# Patient Record
Sex: Female | Born: 2005 | Race: White | Hispanic: Yes | Marital: Single | Smoking: Never smoker
Health system: Southern US, Community
[De-identification: ages and names within clinical notes are randomized; demographics above are authoritative.]

## PROBLEM LIST (undated history)

## (undated) DIAGNOSIS — L309 Dermatitis, unspecified: Secondary | ICD-10-CM

## (undated) HISTORY — DX: Dermatitis, unspecified: L30.9

## (undated) HISTORY — PX: TONSILLECTOMY: SUR1361

---
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
## 2023-03-12 ENCOUNTER — Ambulatory Visit (HOSPITAL_COMMUNITY)
Admission: EM | Admit: 2023-03-12 | Discharge: 2023-03-12 | Disposition: A | Discharge status: Home / Self Care | Payer: Medicaid Other | Attending: Emergency Medicine | Admitting: Emergency Medicine

## 2023-03-12 ENCOUNTER — Encounter (HOSPITAL_COMMUNITY): Payer: Self-pay | Admitting: Emergency Medicine

## 2023-03-12 ENCOUNTER — Other Ambulatory Visit: Payer: Self-pay

## 2023-03-12 DIAGNOSIS — R0981 Nasal congestion: Secondary | ICD-10-CM

## 2023-03-12 DIAGNOSIS — H65192 Other acute nonsuppurative otitis media, left ear: Secondary | ICD-10-CM | POA: Diagnosis not present

## 2023-03-12 MED ORDER — GUAIFENESIN ER 600 MG PO TB12: 600.0000 mg | ORAL_TABLET | Freq: Two times a day (BID) | ORAL | 0 refills | Status: AC

## 2023-03-12 MED ORDER — AMOXICILLIN-POT CLAVULANATE 875-125 MG PO TABS: 1.0000 | ORAL_TABLET | Freq: Two times a day (BID) | ORAL | 0 refills | Status: AC

## 2023-03-12 NOTE — ED Provider Notes (Signed)
[facility]    CSN: [id number] Arrival date & time: 03/12/23  1904     History   Chief Complaint Chief Complaint  Patient presents with   Otalgia    HPI Nancy Shannon is a 17 y.o. female.  Left ear pain for almost 2 weeks Pressure and fullness Worse when she blows her nose Also 1 week of nasal congestion. Reports yellow phlegm. Slight cough. 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   Past Surgical History:  Procedure Laterality Date   TONSILLECTOMY      OB History   No obstetric history on file.      Home Medications    Prior to Admission medications   Medication Sig Start Date End Date Taking? Authorizing Provider  amoxicillin-clavulanate (AUGMENTIN) 875-125 MG tablet Take 1 tablet by mouth every 12 (twelve) hours for 5 days. 03/12/23 03/17/23 Yes Breea Loncar, Lurena Joiner, PA-C  guaiFENesin (MUCINEX) 600 MG 12 hr tablet Take 1 tablet (600 mg total) by mouth 2 (two) times daily for 5 days. 03/12/23 03/17/23 Yes Leander Tout, Lurena Joiner, PA-C  fluticasone [facility]) 50 MCG/ACT nasal spray  12/26/19   [provider]  ibuprofen (ADVIL) 200 MG tablet Take 200 mg by mouth 3 (three) times daily. As needed    [provider]    Family History Family History  Problem Relation Age of Onset   Migraines Mother    Sinusitis Father    Heart murmur Brother     Social History Social History   Tobacco Use   Smoking status: Never   Smokeless tobacco: Never  Vaping Use   Vaping status: Never Used  Substance Use Topics   Alcohol use: No   Drug use: No     Allergies   Patient has no known allergies.   Review of Systems Review of Systems Per HPI  Physical Exam Triage Vital Signs ED Triage Vitals  Encounter Vitals Group     BP      Systolic BP Percentile      Diastolic BP Percentile       Pulse      Resp      Temp      Temp src      SpO2      Weight      Height      Head Circumference      Peak Flow      Pain Score      Pain Loc      Pain Education      Exclude from Growth Chart    No data found.  Updated Vital Signs BP 132/77 (BP Location: Right Arm)   Pulse 70   Temp 98.1 F (36.7 C) (Oral)   Resp 18   LMP 03/07/2023 (Approximate)   SpO2 97%    Physical Exam Vitals and nursing note reviewed.  Constitutional:      General: She is not in acute distress.    Appearance: She is not ill-appearing.  HENT:     Right Ear: Tympanic membrane and ear canal normal.     Left Ear: Tympanic membrane is injected and erythematous.     Ears:     Comments: Cloudy fluid behind left TM    Nose: Congestion and rhinorrhea present.     Mouth/Throat:     Mouth:  Mucous membranes are moist.     Pharynx: Oropharynx is clear. No posterior oropharyngeal erythema.  Eyes:     Extraocular Movements: Extraocular movements intact.     Pupils: Pupils are equal, round, and reactive to light.  Cardiovascular:     Rate and Rhythm: Normal rate and regular rhythm.  Pulmonary:     Effort: Pulmonary effort is normal.  Musculoskeletal:     Cervical back: Normal range of motion.  Skin:    General: Skin is warm and dry.  Neurological:     Mental Status: She is alert and oriented to person, place, and time.     UC Treatments / Results  Labs (all labs ordered are listed, but only abnormal results are displayed) Labs Reviewed - No data to display  EKG  Radiology No results found.  Procedures Procedures   Medications Ordered in UC Medications - No data to display  Initial Impression / Assessment and Plan / UC Course  I have reviewed the triage vital signs and the nursing notes.  Pertinent labs & imaging results that were available during my care of the patient were reviewed by me and considered in my medical decision making (see chart for details).  Left otitis  media Augmentin BID x 5 Discussed this would also cover for any bacterial sinus etiology as well.  Recommend Mucinex twice a day with lots of fluids for further nasal congestion relief.  Return precautions.  Patient agreeable to plan  Final Clinical Impressions(s) / UC Diagnoses   Final diagnoses:  Other non-recurrent acute nonsuppurative otitis media of left ear  Nasal congestion     Discharge Instructions      Please take medication as prescribed. Augmentin twice daily for 5 days Take with food to avoid upset stomach.  The mucinex can be used twice a day to help with congestion Take with LOTS of fluids!!  Return if needed     ED Prescriptions     Medication Sig Dispense Auth. Provider   guaiFENesin (MUCINEX) 600 MG 12 hr tablet Take 1 tablet (600 mg total) by mouth 2 (two) times daily for 5 days. 10 tablet Emile Kyllo, PA-C   amoxicillin-clavulanate (AUGMENTIN) 875-125 MG tablet Take 1 tablet by mouth every 12 (twelve) hours for 5 days. 10 tablet Audi Wettstein, Lurena Joiner, PA-C      PDMP not reviewed this encounter.   Kathrine Haddock 03/12/23 2037

## 2023-03-12 NOTE — ED Triage Notes (Signed)
Left ear pain, feels like air or water in ear.  Pain in left ear when plowing nose.  Patient has stuffy nose and cough.  Illness started on Friday.  Currently continues to have yellow phlegm.  Ear pain started prior to recent illness  Patient has taken tylenol

## 2023-03-12 NOTE — Discharge Instructions (Addendum)
Please take medication as prescribed. Augmentin twice daily for 5 days Take with food to avoid upset stomach.  The mucinex can be used twice a day to help with congestion Take with LOTS of fluids!!  Return if needed

## 2023-05-11 ENCOUNTER — Encounter (HOSPITAL_COMMUNITY): Payer: Self-pay

## 2023-05-11 ENCOUNTER — Ambulatory Visit (HOSPITAL_COMMUNITY)
Admission: EM | Admit: 2023-05-11 | Discharge: 2023-05-11 | Disposition: A | Discharge status: Home / Self Care | Payer: Medicaid Other | Attending: Family Medicine | Admitting: Family Medicine

## 2023-05-11 DIAGNOSIS — M26623 Arthralgia of bilateral temporomandibular joint: Secondary | ICD-10-CM | POA: Diagnosis not present

## 2023-05-11 MED ORDER — IBUPROFEN 600 MG PO TABS: 600.0000 mg | ORAL_TABLET | Freq: Three times a day (TID) | ORAL | 0 refills | Status: AC | PRN

## 2023-05-11 NOTE — ED Provider Notes (Signed)
[facility]    CSN: [id number] Arrival date & time: 05/11/23  1531      History   Chief Complaint Chief Complaint  Patient presents with   Otalgia   Jaw Pain    HPI Nancy Shannon is a 18 y.o. female.    Otalgia Here for bilateral jaw and ear pain.  It started about January 31.  No fever and no cough or congestion or sore throat.  She did have an ear infection on the left a few weeks ago, so she was first concerned that might be what was going on.  But then she developed pain in front of each ear just in front of the antitragus  NKDA  Last menstrual cycle was January 29  Past Medical History:  Diagnosis Date   Eczema     There are no active problems to display for this patient.   Past Surgical History:  Procedure Laterality Date   TONSILLECTOMY      OB History   No obstetric history on file.      Home Medications    Prior to Admission medications   Medication Sig Start Date End Date Taking? Authorizing Provider  ibuprofen (ADVIL) 600 MG tablet Take 1 tablet (600 mg total) by mouth every 8 (eight) hours as needed (pain). 05/11/23  Yes Zenia Resides, MD  fluticasone [facility]) 50 MCG/ACT nasal spray  12/26/19   [provider]    Family History Family History  Problem Relation Age of Onset   Migraines Mother    Sinusitis Father    Heart murmur Brother     Social History Social History   Tobacco Use   Smoking status: Never   Smokeless tobacco: Never  Vaping Use   Vaping status: Never Used  Substance Use Topics   Alcohol use: No   Drug use: No     Allergies   Patient has no known allergies.   Review of Systems Review of Systems  HENT:  Positive for ear pain.      Physical Exam Triage Vital Signs ED Triage Vitals [05/11/23 1749]  Encounter Vitals Group     BP 114/74     Systolic BP Percentile      Diastolic BP Percentile      Pulse Rate 71     Resp 14     Temp 97.9 F (36.6 C)     Temp Source  Oral     SpO2 99 %     Weight      Height      Head Circumference      Peak Flow      Pain Score 6     Pain Loc      Pain Education      Exclude from Growth Chart    No data found.  Updated Vital Signs BP 114/74 (BP Location: Left Arm)   Pulse 71   Temp 97.9 F (36.6 C) (Oral)   Resp 14   LMP 05/06/2023   SpO2 99%   Visual Acuity Right Eye Distance:   Left Eye Distance:   Bilateral Distance:    Right Eye Near:   Left Eye Near:    Bilateral Near:     Physical Exam Vitals reviewed.  Constitutional:      General: She is not in acute distress.    Appearance: She is not toxic-appearing.  HENT:     Right Ear: Tympanic membrane, ear canal and external ear normal.  Left Ear: Tympanic membrane, ear canal and external ear normal.     Ears:     Comments: Tympanic membrane's are bilaterally gray and shiny.  The canals also are clear and without discharge.  There is no pain on traction of the pinna.  There is no swelling around the ear.  There is tenderness on palpation of the TMJ joint bilaterally anterior to each outer ear.    Nose: Nose normal.     Mouth/Throat:     Mouth: Mucous membranes are moist.  Eyes:     Extraocular Movements: Extraocular movements intact.     Conjunctiva/sclera: Conjunctivae normal.     Pupils: Pupils are equal, round, and reactive to light.  Cardiovascular:     Rate and Rhythm: Normal rate and regular rhythm.  Pulmonary:     Effort: Pulmonary effort is normal.     Breath sounds: Normal breath sounds.  Musculoskeletal:     Cervical back: Neck supple.  Lymphadenopathy:     Cervical: No cervical adenopathy.  Skin:    Coloration: Skin is not jaundiced or pale.  Neurological:     General: No focal deficit present.     Mental Status: She is alert and oriented to person, place, and time.  Psychiatric:        Behavior: Behavior normal.      UC Treatments / Results  Labs (all labs ordered are listed, but only abnormal results are  displayed) Labs Reviewed - No data to display  EKG   Radiology No results found.  Procedures Procedures (including critical care time)  Medications Ordered in UC Medications - No data to display  Initial Impression / Assessment and Plan / UC Course  I have reviewed the triage vital signs and the nursing notes.  Pertinent labs & imaging results that were available during my care of the patient were reviewed by me and considered in my medical decision making (see chart for details).     Ibuprofen is sent in for TMJ pain.  I discussed with her to limit her to eat foods.  She will follow-up with her primary care and possibly her dentist. Final Clinical Impressions(s) / UC Diagnoses   Final diagnoses:  Bilateral temporomandibular joint pain     Discharge Instructions      Take ibuprofen 600 mg--1 tab every 8 hours as needed for pain.  Follow-up with your primary care about this issue      ED Prescriptions     Medication Sig Dispense Auth. Provider   ibuprofen (ADVIL) 600 MG tablet Take 1 tablet (600 mg total) by mouth every 8 (eight) hours as needed (pain). 15 tablet Jennifermarie Franzen, Janace Aris, MD      PDMP not reviewed this encounter.   Zenia Resides, MD 05/11/23 [phone]

## 2023-05-11 NOTE — ED Triage Notes (Signed)
Patient c/o having bilateral ear and jaw pain x 3 days. And worsening. L>R.  Patient states she has been taking Tylenol and the last dose was at 1400 today. And ear ache OTC medication.

## 2023-05-11 NOTE — Discharge Instructions (Signed)
Take ibuprofen 600 mg--1 tab every 8 hours as needed for pain.  Follow-up with your primary care about this issue
